# Patient Record
Sex: Male | Born: 2000 | Race: Black or African American | Hispanic: No | Marital: Single | Smoking: Never smoker
Health system: Southern US, Community
[De-identification: ages and names within clinical notes are randomized; demographics above are authoritative.]

---
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
## 2020-05-02 ENCOUNTER — Encounter (HOSPITAL_COMMUNITY): Payer: Self-pay

## 2020-05-02 ENCOUNTER — Other Ambulatory Visit: Payer: Self-pay

## 2020-05-02 ENCOUNTER — Ambulatory Visit (HOSPITAL_COMMUNITY)
Admission: EM | Admit: 2020-05-02 | Discharge: 2020-05-02 | Disposition: A | Discharge status: Home / Self Care | Payer: Medicaid Other | Attending: Family Medicine | Admitting: Family Medicine

## 2020-05-02 DIAGNOSIS — R369 Urethral discharge, unspecified: Secondary | ICD-10-CM | POA: Diagnosis not present

## 2020-05-02 MED ORDER — CEFTRIAXONE SODIUM 500 MG IJ SOLR
500.0000 mg | Freq: Once | INTRAMUSCULAR | Status: AC
  Administered 2020-05-02: 500 mg via INTRAMUSCULAR

## 2020-05-02 MED ORDER — AZITHROMYCIN 250 MG PO TABS
1000.0000 mg | ORAL_TABLET | Freq: Once | ORAL | Status: AC
  Administered 2020-05-02: 1000 mg via ORAL

## 2020-05-02 MED ORDER — AZITHROMYCIN 250 MG PO TABS
ORAL_TABLET | ORAL | Status: AC
  Filled 2020-05-02: qty 4

## 2020-05-02 MED ORDER — LIDOCAINE HCL (PF) 1 % IJ SOLN
INTRAMUSCULAR | Status: AC
  Filled 2020-05-02: qty 2

## 2020-05-02 MED ORDER — CEFTRIAXONE SODIUM 500 MG IJ SOLR
INTRAMUSCULAR | Status: AC
  Filled 2020-05-02: qty 500

## 2020-05-02 NOTE — ED Provider Notes (Signed)
[facility]    CSN: [id number] Arrival date & time: 05/02/20  0935      History   Chief Complaint Chief Complaint  Patient presents with  . Penile Discharge  . burning sensation    HPI Roberto Jones is a 20 y.o. male.   Pt is a 20 year old male that presents with penile discharge and dysuria for the past week. Possible STD exposure.  No testicle pain swelling, hematuria or urinary frequency.     History reviewed. No pertinent past medical history.  Patient Active Problem List   Diagnosis Date Noted  . Appendicitis 04/18/2018    Past Surgical History:  Procedure Laterality Date  . LAPAROSCOPIC APPENDECTOMY N/A 04/18/2018   Procedure: APPENDECTOMY LAPAROSCOPIC;  Surgeon: Manus Rudd, MD;  Location: [facility] OR;  Service: General;  Laterality: N/A;       Home Medications    Prior to Admission medications   Not on File    Family History History reviewed. No pertinent family history.  Social History Social History   Tobacco Use  . Smoking status: Never Smoker  . Smokeless tobacco: Never Used     Allergies   Patient has no known allergies.   Review of Systems Review of Systems   Physical Exam Triage Vital Signs ED Triage Vitals  Enc Vitals Group     BP 05/02/20 1002 136/87     Pulse Rate 05/02/20 1002 67     Resp 05/02/20 1002 18     Temp 05/02/20 1002 98.6 F (37 C)     Temp src --      SpO2 05/02/20 1002 100 %     Weight --      Height --      Head Circumference --      Peak Flow --      Pain Score 05/02/20 1001 0     Pain Loc --      Pain Edu? --      Excl. in GC? --    No data found.  Updated Vital Signs BP 136/87   Pulse 67   Temp 98.6 F (37 C)   Resp 18   SpO2 100%   Visual Acuity Right Eye Distance:   Left Eye Distance:   Bilateral Distance:    Right Eye Near:   Left Eye Near:    Bilateral Near:     Physical Exam Vitals and nursing note reviewed.  Constitutional:      Appearance: Normal appearance.   HENT:     Head: Normocephalic and atraumatic.  Eyes:     Conjunctiva/sclera: Conjunctivae normal.  Pulmonary:     Effort: Pulmonary effort is normal.  Musculoskeletal:        General: Normal range of motion.     Cervical back: Normal range of motion.  Skin:    General: Skin is warm and dry.  Neurological:     Mental Status: He is alert.  Psychiatric:        Mood and Affect: Mood normal.      UC Treatments / Results  Labs (all labs ordered are listed, but only abnormal results are displayed) Labs Reviewed  CYTOLOGY, (ORAL, ANAL, URETHRAL) ANCILLARY ONLY    EKG   Radiology No results found.  Procedures Procedures (including critical care time)  Medications Ordered in UC Medications  azithromycin (ZITHROMAX) tablet 1,000 mg (1,000 mg Oral Given 05/02/20 1041)  cefTRIAXone (ROCEPHIN) injection 500 mg (500 mg Intramuscular Given 05/02/20 1041)  Initial Impression / Assessment and Plan / UC Course  I have reviewed the triage vital signs and the nursing notes.  Pertinent labs & imaging results that were available during my care of the patient were reviewed by me and considered in my medical decision making (see chart for details).     Penile discharge and dysuria.  Based on symptoms and exposure we will go ahead and treat today for gonorrhea chlamydia in clinic.  Swab sent for testing.  Recommend avoid sexual activity for 7 days. Follow up as needed for continued or worsening symptoms  Final Clinical Impressions(s) / UC Diagnoses   Final diagnoses:  Penile discharge     Discharge Instructions     Treating you for gonorrhea and chlamydia here today Swab sent fore testing Avoid sexual activity x 7 days.  Follow up as needed for continued or worsening symptoms     ED Prescriptions    None     PDMP not reviewed this encounter.   Janace Aris, NP 05/02/20 [phone]

## 2020-05-02 NOTE — ED Triage Notes (Signed)
Pt in with c/o penile discharge and burning sensation that has been going on for 1 week now  Requesting STD testing

## 2020-05-02 NOTE — Discharge Instructions (Addendum)
Treating you for gonorrhea and chlamydia here today Swab sent fore testing Avoid sexual activity x 7 days.  Follow up as needed for continued or worsening symptoms

## 2020-05-05 LAB — CYTOLOGY, (ORAL, ANAL, URETHRAL) ANCILLARY ONLY
Chlamydia: NEGATIVE
Comment: NEGATIVE
Comment: NEGATIVE
Comment: NORMAL
Neisseria Gonorrhea: POSITIVE — AB
Trichomonas: NEGATIVE

## 2020-05-18 IMAGING — CT CT ABD-PELV W/ CM
2 of 4 series · 16 of 46 positions shown, 18 images · IV contrast (Omni 300)
Comparison: None.

CLINICAL DATA: Right lower quadrant pain since [REDACTED].

EXAM:
CT ABDOMEN AND PELVIS WITH CONTRAST
TECHNIQUE: Multidetector CT imaging of the abdomen and pelvis was performed
using the standard protocol following bolus administration of
intravenous contrast.
CONTRAST:  100mL OMNIPAQUE IOHEXOL 300 MG/ML  SOLN

[Series 3: a/p w/ 5mm · axial · 0.71mm/px · z∈[+747,+1162]mm · 13 of 91 slices shown, 15 images]
[im 4/91  soft-tissue]
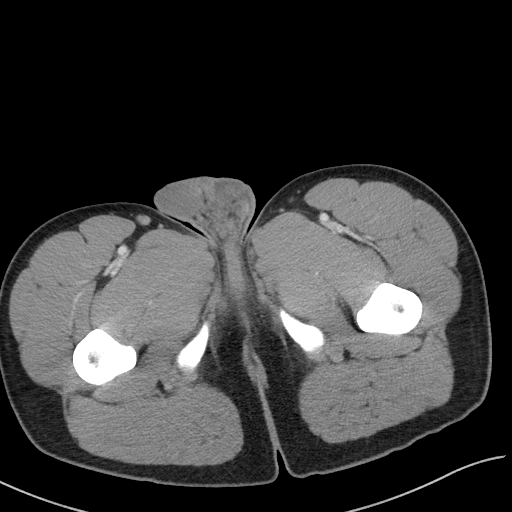
[im 4/91  bone]
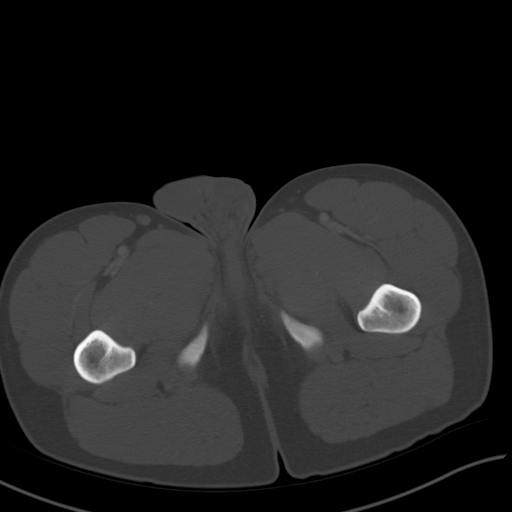
[im 11/91  soft-tissue]
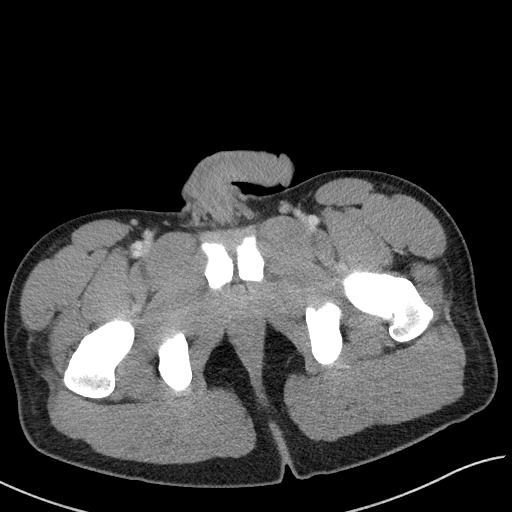
[im 19/91  soft-tissue]
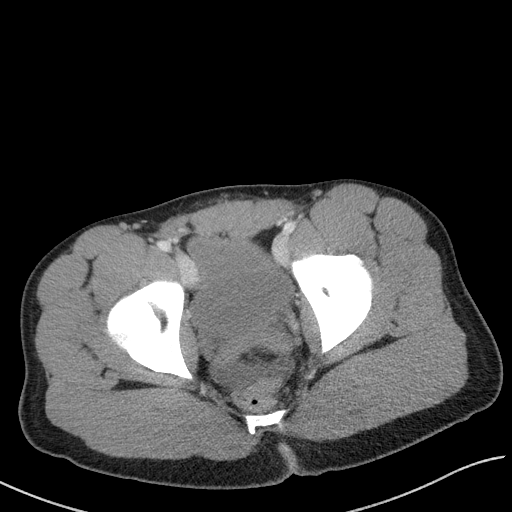
[im 26/91  soft-tissue]
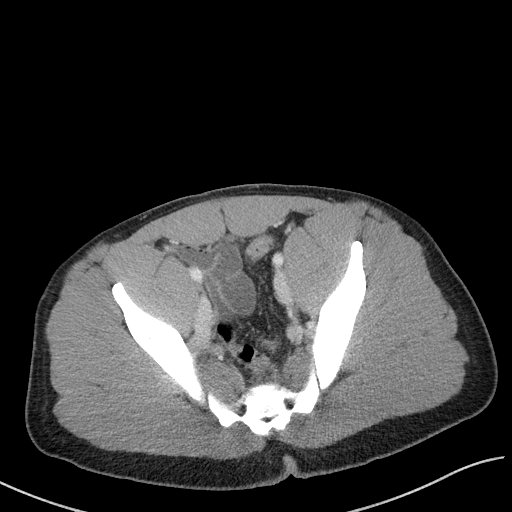
[im 33/91  soft-tissue]
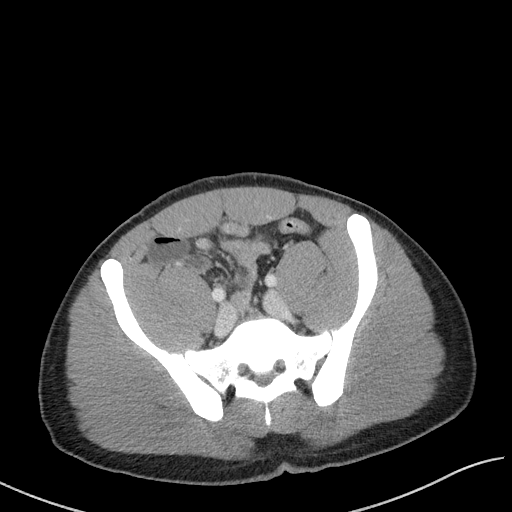
[im 40/91  soft-tissue]
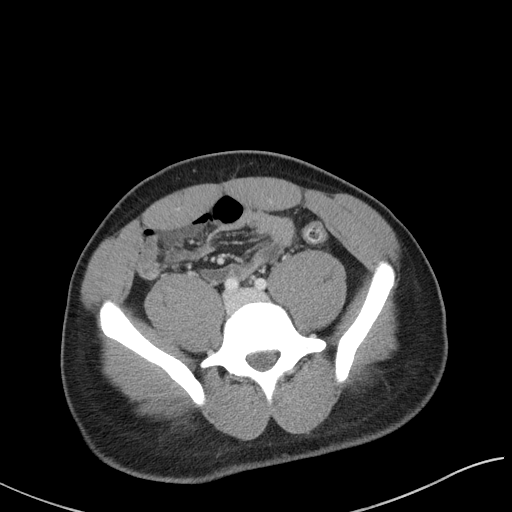
[im 47/91  soft-tissue]
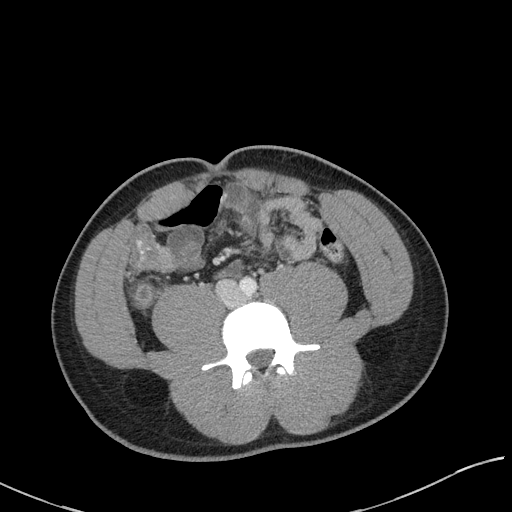
[im 51/91  soft-tissue]
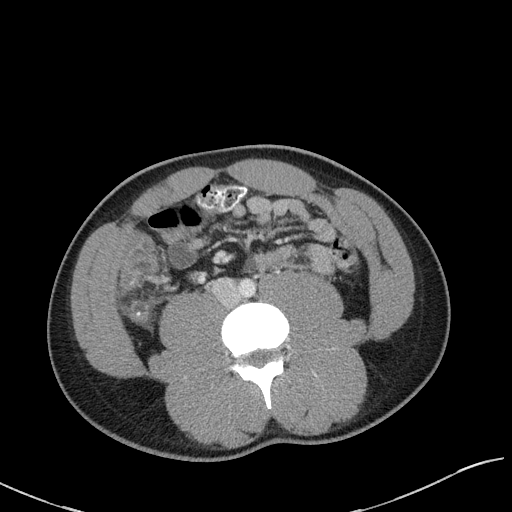
[im 58/91  soft-tissue]
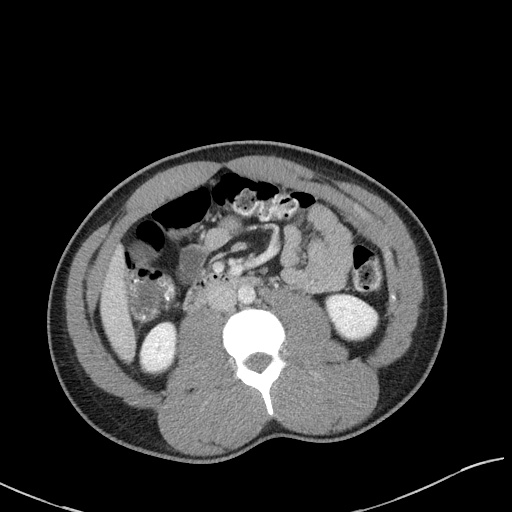
[im 58/91  bone]
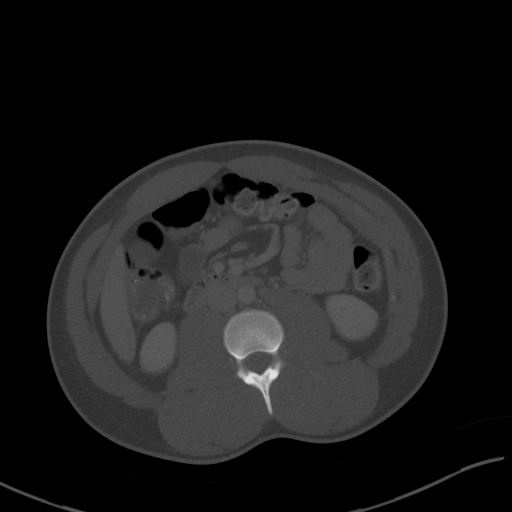
[im 65/91  soft-tissue]
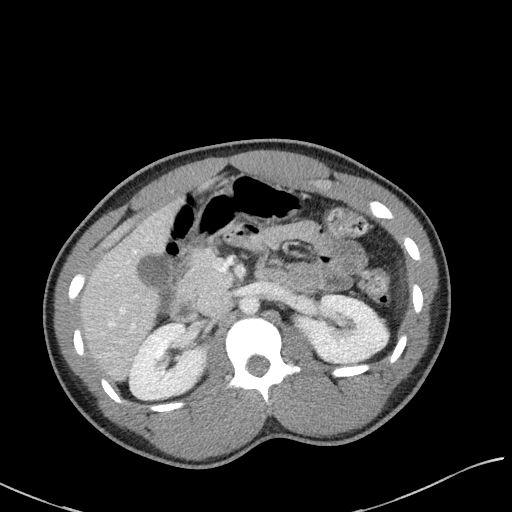
[im 73/91  soft-tissue]
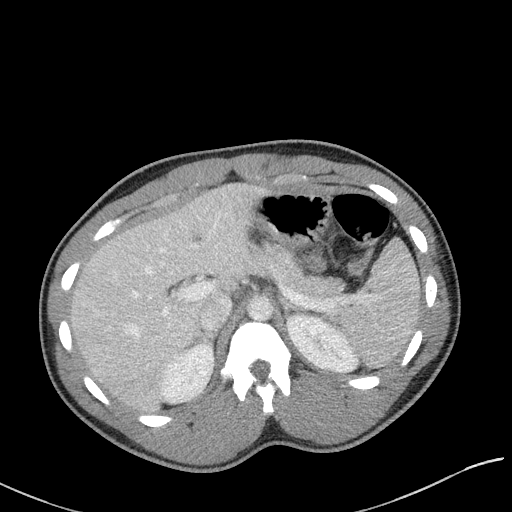
[im 80/91  soft-tissue]
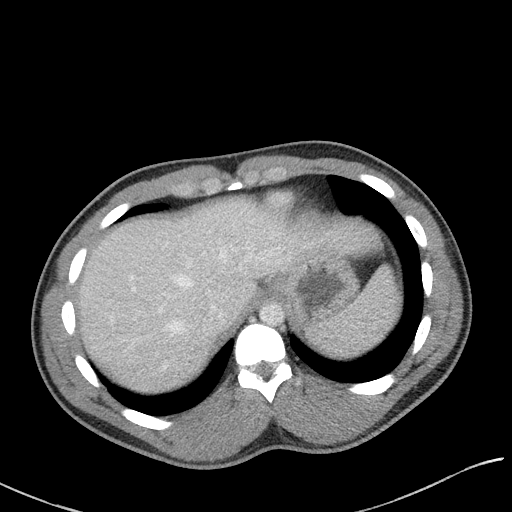
[im 87/91  soft-tissue]
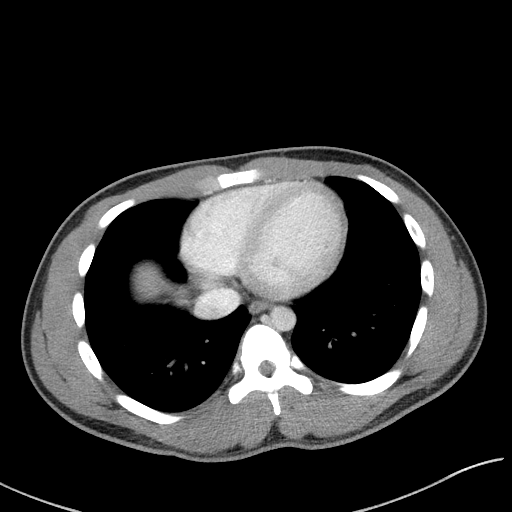

[Series 6: a/p w/ cor · coronal · 0.79mm/px · 3 of 143 slices shown]
[im 48/143  soft-tissue]
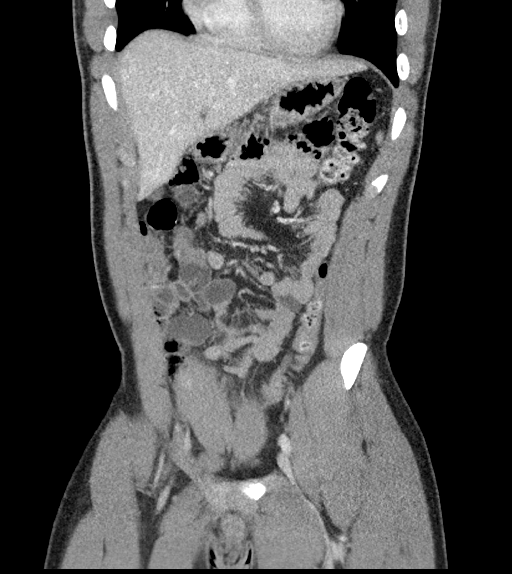
[im 64/143  soft-tissue]
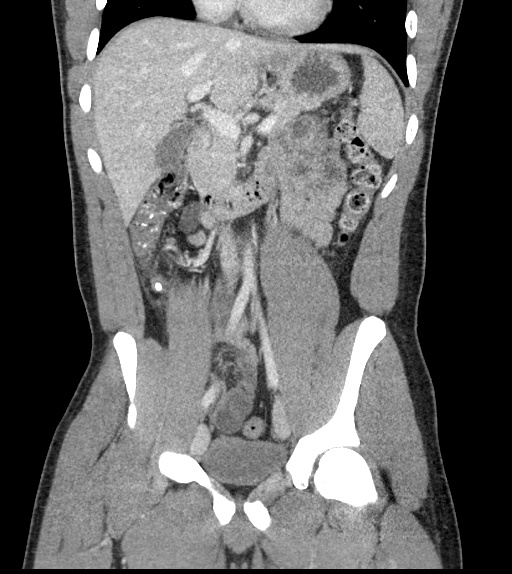
[im 79/143  soft-tissue]
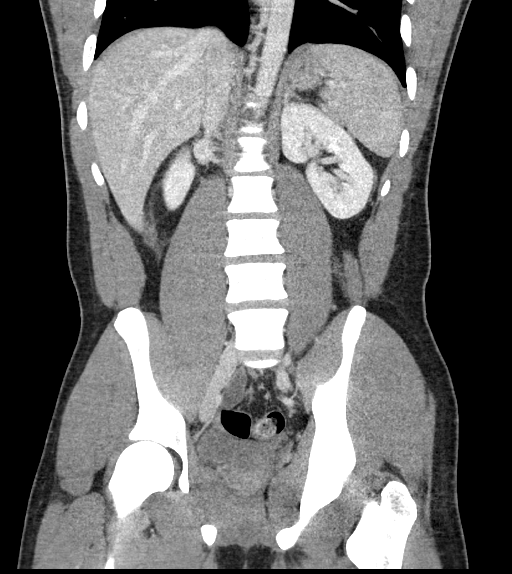

[16 of 46 positions shown; findings below may reference images not displayed]

FINDINGS: Lower chest: Lung bases are clear.

Hepatobiliary: No focal liver abnormality is seen. No gallstones,
gallbladder wall thickening, or biliary dilatation.

Pancreas: Unremarkable. No pancreatic ductal dilatation or
surrounding inflammatory changes.

Spleen: Normal in size without focal abnormality.

Adrenals/Urinary Tract: Adrenal glands are unremarkable. Kidneys are
normal, without renal calculi, focal lesion, or hydronephrosis.
Bladder is unremarkable.

Stomach/Bowel: Stomach, small bowel, and colon are not abnormally
distended. Distended and fluid-filled appendix with appendiceal
diameter of 16 mm. There is periappendiceal stranding. Three
appendicoliths are present. Changes are consistent with acute
appendicitis. No abscess.

Appendix: Location: Retrocecal

Diameter: 16 mm

Appendicolith: Multiple

Mucosal hyper-enhancement: Yes

Extraluminal gas: No

Periappendiceal collection: No

Vascular/Lymphatic: No significant vascular findings are present. No
enlarged abdominal or pelvic lymph nodes.

Reproductive: Prostate is unremarkable.

Other: No free air or free fluid in the abdomen. Abdominal wall
musculature appears intact.

Musculoskeletal: No acute or significant osseous findings.
IMPRESSION: Changes of acute appendicitis.  Three appendicoliths.  No abscess.
